# Patient Record
Sex: Male | Born: 1985 | Hispanic: No | Marital: Single | Smoking: Current every day smoker
Health system: Southern US, Community
[De-identification: ages and names within clinical notes are randomized; demographics above are authoritative.]

## PROBLEM LIST (undated history)

## (undated) DIAGNOSIS — R569 Unspecified convulsions: Secondary | ICD-10-CM

---

## 2010-06-12 ENCOUNTER — Emergency Department (HOSPITAL_COMMUNITY): Payer: No Typology Code available for payment source

## 2010-06-12 ENCOUNTER — Emergency Department (HOSPITAL_COMMUNITY)
Admission: EM | Admit: 2010-06-12 | Discharge: 2010-06-13 | Disposition: A | Discharge status: Other Acute Inpatient Hospital | Payer: No Typology Code available for payment source | Source: Home / Self Care | Attending: Emergency Medicine | Admitting: Emergency Medicine

## 2010-06-12 DIAGNOSIS — R51 Headache: Secondary | ICD-10-CM | POA: Insufficient documentation

## 2010-06-12 DIAGNOSIS — R569 Unspecified convulsions: Secondary | ICD-10-CM | POA: Insufficient documentation

## 2010-06-12 DIAGNOSIS — R109 Unspecified abdominal pain: Secondary | ICD-10-CM | POA: Insufficient documentation

## 2010-06-12 DIAGNOSIS — S2239XA Fracture of one rib, unspecified side, initial encounter for closed fracture: Secondary | ICD-10-CM | POA: Insufficient documentation

## 2010-06-12 DIAGNOSIS — S3600XA Unspecified injury of spleen, initial encounter: Secondary | ICD-10-CM | POA: Insufficient documentation

## 2010-06-12 DIAGNOSIS — R079 Chest pain, unspecified: Secondary | ICD-10-CM | POA: Insufficient documentation

## 2010-06-12 LAB — URINE MICROSCOPIC-ADD ON

## 2010-06-12 LAB — CBC
Hemoglobin: 13.1 g/dL (ref 13.0–17.0)
MCH: 31.4 pg (ref 26.0–34.0)
MCHC: 34.6 g/dL (ref 30.0–36.0)
MCV: 90.9 fL (ref 78.0–100.0)
RBC: 4.17 MIL/uL — ABNORMAL LOW (ref 4.22–5.81)

## 2010-06-12 LAB — DIFFERENTIAL
Lymphs Abs: 1.1 10*3/uL (ref 0.7–4.0)
Monocytes Absolute: 0.8 10*3/uL (ref 0.1–1.0)
Monocytes Relative: 8 % (ref 3–12)
Neutro Abs: 8.1 10*3/uL — ABNORMAL HIGH (ref 1.7–7.7)
Neutrophils Relative %: 79 % — ABNORMAL HIGH (ref 43–77)

## 2010-06-12 LAB — URINALYSIS, ROUTINE W REFLEX MICROSCOPIC
Nitrite: POSITIVE — AB
Protein, ur: 30 mg/dL — AB
Specific Gravity, Urine: 1.025 (ref 1.005–1.030)
Urobilinogen, UA: 1 mg/dL (ref 0.0–1.0)

## 2010-06-12 LAB — COMPREHENSIVE METABOLIC PANEL
ALT: 38 U/L (ref 0–53)
BUN: 19 mg/dL (ref 6–23)
CO2: 28 mEq/L (ref 19–32)
Calcium: 9.7 mg/dL (ref 8.4–10.5)
Creatinine, Ser: 0.83 mg/dL (ref 0.4–1.5)
GFR calc non Af Amer: >60 mL/min (ref >60)
Glucose, Bld: 102 mg/dL — ABNORMAL HIGH (ref 70–99)
Sodium: 138 mEq/L (ref 135–145)

## 2010-06-12 LAB — RAPID URINE DRUG SCREEN, HOSP PERFORMED
Amphetamines: NOT DETECTED
Benzodiazepines: NOT DETECTED
Tetrahydrocannabinol: POSITIVE — AB

## 2010-06-12 LAB — ETHANOL: Alcohol, Ethyl (B): <5 mg/dL (ref 0–10)

## 2010-06-12 MED ORDER — IOHEXOL 300 MG/ML  SOLN
100.0000 mL | Freq: Once | INTRAMUSCULAR | Status: AC | PRN
  Administered 2010-06-12: 100 mL via INTRAVENOUS

## 2010-06-13 ENCOUNTER — Inpatient Hospital Stay (HOSPITAL_COMMUNITY): Payer: No Typology Code available for payment source

## 2010-06-13 ENCOUNTER — Inpatient Hospital Stay (HOSPITAL_COMMUNITY)
Admission: AD | Admit: 2010-06-13 | Discharge: 2010-06-15 | DRG: 964 | Disposition: A | Discharge status: Home / Self Care | Payer: No Typology Code available for payment source | Source: Ambulatory Visit | Attending: General Surgery | Admitting: General Surgery

## 2010-06-13 DIAGNOSIS — Z560 Unemployment, unspecified: Secondary | ICD-10-CM

## 2010-06-13 DIAGNOSIS — F1010 Alcohol abuse, uncomplicated: Secondary | ICD-10-CM | POA: Diagnosis present

## 2010-06-13 DIAGNOSIS — R55 Syncope and collapse: Secondary | ICD-10-CM | POA: Diagnosis present

## 2010-06-13 DIAGNOSIS — S060X9A Concussion with loss of consciousness of unspecified duration, initial encounter: Secondary | ICD-10-CM | POA: Diagnosis present

## 2010-06-13 DIAGNOSIS — S0085XA Superficial foreign body of other part of head, initial encounter: Secondary | ICD-10-CM | POA: Diagnosis present

## 2010-06-13 DIAGNOSIS — S3600XA Unspecified injury of spleen, initial encounter: Secondary | ICD-10-CM | POA: Diagnosis present

## 2010-06-13 DIAGNOSIS — A498 Other bacterial infections of unspecified site: Secondary | ICD-10-CM | POA: Diagnosis present

## 2010-06-13 DIAGNOSIS — S1095XA Superficial foreign body of unspecified part of neck, initial encounter: Secondary | ICD-10-CM | POA: Diagnosis present

## 2010-06-13 DIAGNOSIS — F17200 Nicotine dependence, unspecified, uncomplicated: Secondary | ICD-10-CM | POA: Diagnosis present

## 2010-06-13 DIAGNOSIS — G40909 Epilepsy, unspecified, not intractable, without status epilepticus: Secondary | ICD-10-CM | POA: Diagnosis present

## 2010-06-13 DIAGNOSIS — N390 Urinary tract infection, site not specified: Secondary | ICD-10-CM | POA: Diagnosis present

## 2010-06-13 DIAGNOSIS — J9383 Other pneumothorax: Secondary | ICD-10-CM | POA: Diagnosis present

## 2010-06-13 DIAGNOSIS — F1210 Cannabis abuse, uncomplicated: Secondary | ICD-10-CM | POA: Diagnosis present

## 2010-06-13 DIAGNOSIS — G5790 Unspecified mononeuropathy of unspecified lower limb: Secondary | ICD-10-CM | POA: Diagnosis present

## 2010-06-13 DIAGNOSIS — S0005XA Superficial foreign body of scalp, initial encounter: Secondary | ICD-10-CM | POA: Diagnosis present

## 2010-06-13 DIAGNOSIS — M4120 Other idiopathic scoliosis, site unspecified: Secondary | ICD-10-CM | POA: Diagnosis present

## 2010-06-13 DIAGNOSIS — S239XXA Sprain of unspecified parts of thorax, initial encounter: Secondary | ICD-10-CM | POA: Diagnosis present

## 2010-06-13 DIAGNOSIS — S2249XA Multiple fractures of ribs, unspecified side, initial encounter for closed fracture: Principal | ICD-10-CM | POA: Diagnosis present

## 2010-06-13 LAB — GLUCOSE, CAPILLARY: Glucose-Capillary: 87 mg/dL (ref 70–99)

## 2010-06-13 LAB — BASIC METABOLIC PANEL
Calcium: 8.5 mg/dL (ref 8.4–10.5)
Chloride: 106 mEq/L (ref 96–112)
Creatinine, Ser: 0.77 mg/dL (ref 0.4–1.5)
GFR calc Af Amer: >60 mL/min (ref >60)

## 2010-06-13 LAB — CBC
MCH: 31.2 pg (ref 26.0–34.0)
Platelets: 216 10*3/uL (ref 150–400)
RBC: 3.72 MIL/uL — ABNORMAL LOW (ref 4.22–5.81)

## 2010-06-13 LAB — MRSA PCR SCREENING: MRSA by PCR: NEGATIVE

## 2010-06-14 ENCOUNTER — Inpatient Hospital Stay (HOSPITAL_COMMUNITY): Payer: No Typology Code available for payment source

## 2010-06-14 LAB — URINE CULTURE

## 2010-06-14 LAB — GLUCOSE, CAPILLARY: Glucose-Capillary: 150 mg/dL — ABNORMAL HIGH (ref 70–99)

## 2010-06-15 LAB — CBC
HCT: 33.3 % — ABNORMAL LOW (ref 39.0–52.0)
Hemoglobin: 11.3 g/dL — ABNORMAL LOW (ref 13.0–17.0)
WBC: 7.8 10*3/uL (ref 4.0–10.5)
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
## 2010-09-04 ENCOUNTER — Other Ambulatory Visit (HOSPITAL_COMMUNITY): Payer: Self-pay | Admitting: Chiropractic Medicine

## 2010-09-04 DIAGNOSIS — M25569 Pain in unspecified knee: Secondary | ICD-10-CM

## 2010-09-06 ENCOUNTER — Ambulatory Visit (HOSPITAL_COMMUNITY)
Admission: RE | Admit: 2010-09-06 | Discharge: 2010-09-06 | Disposition: A | Discharge status: Home / Self Care | Payer: Self-pay | Source: Ambulatory Visit | Attending: Chiropractic Medicine | Admitting: Chiropractic Medicine

## 2010-09-06 DIAGNOSIS — M6750 Plica syndrome, unspecified knee: Secondary | ICD-10-CM | POA: Insufficient documentation

## 2010-09-06 DIAGNOSIS — M25569 Pain in unspecified knee: Secondary | ICD-10-CM | POA: Insufficient documentation

## 2010-12-05 ENCOUNTER — Inpatient Hospital Stay (HOSPITAL_COMMUNITY)
Admission: RE | Admit: 2010-12-05 | Discharge: 2010-12-05 | Disposition: A | Discharge status: Home / Self Care | Payer: Self-pay | Source: Ambulatory Visit | Attending: Emergency Medicine | Admitting: Emergency Medicine

## 2013-04-10 ENCOUNTER — Encounter (HOSPITAL_COMMUNITY): Payer: Self-pay | Admitting: Emergency Medicine

## 2013-04-10 ENCOUNTER — Emergency Department (HOSPITAL_COMMUNITY)
Admission: EM | Admit: 2013-04-10 | Discharge: 2013-04-10 | Disposition: A | Discharge status: Home / Self Care | Payer: No Typology Code available for payment source | Attending: Emergency Medicine | Admitting: Emergency Medicine

## 2013-04-10 DIAGNOSIS — K029 Dental caries, unspecified: Secondary | ICD-10-CM | POA: Insufficient documentation

## 2013-04-10 DIAGNOSIS — K047 Periapical abscess without sinus: Secondary | ICD-10-CM

## 2013-04-10 DIAGNOSIS — Z8669 Personal history of other diseases of the nervous system and sense organs: Secondary | ICD-10-CM | POA: Insufficient documentation

## 2013-04-10 DIAGNOSIS — K0510 Chronic gingivitis, plaque induced: Secondary | ICD-10-CM | POA: Insufficient documentation

## 2013-04-10 DIAGNOSIS — K120 Recurrent oral aphthae: Secondary | ICD-10-CM | POA: Insufficient documentation

## 2013-04-10 DIAGNOSIS — K006 Disturbances in tooth eruption: Secondary | ICD-10-CM | POA: Insufficient documentation

## 2013-04-10 HISTORY — DX: Unspecified convulsions: R56.9

## 2013-04-10 MED ORDER — IBUPROFEN 800 MG PO TABS: 800.0000 mg | ORAL_TABLET | Freq: Three times a day (TID) | ORAL | Status: DC

## 2013-04-10 MED ORDER — PENICILLIN V POTASSIUM 250 MG PO TABS: 500.0000 mg | ORAL_TABLET | Freq: Four times a day (QID) | ORAL | Status: AC

## 2013-04-10 MED ORDER — HYDROCODONE-ACETAMINOPHEN 5-325 MG PO TABS: 1.0000 | ORAL_TABLET | Freq: Four times a day (QID) | ORAL | Status: DC | PRN

## 2013-04-10 MED ORDER — OXYCODONE-ACETAMINOPHEN 5-325 MG PO TABS
1.0000 | ORAL_TABLET | Freq: Once | ORAL | Status: AC
  Administered 2013-04-10: 1 via ORAL
  Filled 2013-04-10: qty 1

## 2013-04-10 NOTE — Discharge Instructions (Signed)
Brush teeth twice a day. Listerine rinses twice a day. Ibuprofen for pain. Norco for severe pain. Penicillin for infection until all gone. Follow up with a dentist. Return if worsening.    Dental Abscess A dental abscess is a collection of infected fluid (pus) from a bacterial infection in the inner part of the tooth (pulp). It usually occurs at the end of the tooth's root.  CAUSES   Severe tooth decay.  Trauma to the tooth that allows bacteria to enter into the pulp, such as a broken or chipped tooth. SYMPTOMS   Severe pain in and around the infected tooth.  Swelling and redness around the abscessed tooth or in the mouth or face.  Tenderness.  Pus drainage.  Bad breath.  Bitter taste in the mouth.  Difficulty swallowing.  Difficulty opening the mouth.  Nausea.  Vomiting.  Chills.  Swollen neck glands. DIAGNOSIS   A medical and dental history will be taken.  An examination will be performed by tapping on the abscessed tooth.  X-rays may be taken of the tooth to identify the abscess. TREATMENT The goal of treatment is to eliminate the infection. You may be prescribed antibiotic medicine to stop the infection from spreading. A root canal may be performed to save the tooth. If the tooth cannot be saved, it may be pulled (extracted) and the abscess may be drained.  HOME CARE INSTRUCTIONS  Only take over-the-counter or prescription medicines for pain, fever, or discomfort as directed by your caregiver.  Rinse your mouth (gargle) often with salt water ( tsp salt in 8 oz [250 ml] of warm water) to relieve pain or swelling.  Do not drive after taking pain medicine (narcotics).  Do not apply heat to the outside of your face.  Return to your dentist for further treatment as directed. SEEK MEDICAL CARE IF:  Your pain is not helped by medicine.  Your pain is getting worse instead of better. SEEK IMMEDIATE MEDICAL CARE IF:  You have a fever or persistent symptoms for  more than 2 3 days.  You have a fever and your symptoms suddenly get worse.  You have chills or a very bad headache.  You have problems breathing or swallowing.  You have trouble opening your mouth.  You have swelling in the neck or around the eye. Document Released: 02/12/2005 Document Revised: 11/07/2011 Document Reviewed: 05/23/2010 [facility] Patient Information 2014 [place], [place].  Gingivitis Gingivitis is a form of gum (periodontal) disease that causes redness, soreness, and swelling (inflammation) of your gums. CAUSES The most common cause of gingivitis is poor oral hygiene. A sticky substance made of bacteria, mucus, and food particles (plaque), is deposited on the exposed part of teeth. As plaque builds up, it reacts with the saliva in your mouth to form something called  tartar. Tartar is a hard deposit that becomes trapped around the base of the tooth. Plaque and tartar irritate the gums, leading to the formation of gingivitis. Other factors that increase your risk for gingivitis include:   Tobacco use.  Diabetes.  Older age.  Certain medications.  Certain viral or fungal infections.  Dry mouth.  Hormonal changes such as during pregnancy.  Poor nutrition.  Substance abuse.  Poor fitting dental restorations or appliances. SYMPTOMS You may notice inflammation of the soft tissue (gingiva) around the teeth. When these tissues become inflamed, they bleed easily, especially during flossing or brushing. The gums may also be:   Tender to the touch.  Bright red, purple red, or  have a shiny appearance.  Swollen.  Wearing away from the teeth (receding), which exposes more of the tooth. Bad breath is often present. Continued infection around teeth can eventually cause cavities and loosen teeth. This may lead to eventual tooth loss. DIAGNOSIS A medical and dental history will be taken. Your mouth, teeth, and gums will be examined. Your dentist will look for soft,  swollen purple-red, irritated gums. There may be deposits of plaque and tartar at the base of the teeth. Your gums will be looked at for the degree of redness, puffiness, and bleeding tendencies. Your dentist will see if any of the teeth are loose. X-rays may be taken to see if the inflammation has spread to the supporting structures of the teeth. TREATMENT The goal is to reduce and reverse the inflammation. Proper treatment can usually reverse the symptoms of gingivitis and prevent further progression of the disease. Have your teeth cleaned. During the cleaning, all plaque and tartar will be removed. Instruction for proper home care will be given. You will need regular professional cleanings and check-ups in the future. HOME CARE INSTRUCTIONS  Brush your teeth twice a day and floss at least once per day. When flossing, it is best to floss first then brush.  Limit sugar between meals and maintain a well-balanced diet.  Even the best dental hygiene will not prevent plaque from developing. It is necessary for you to see your dentist on a regular basis for cleaning and regular checkups.  Your dentist can recommend proper oral hygiene and mouth care and suggest special toothpastes or mouth rinses.  Stop smoking. SEEK DENTAL OR MEDICAL CARE IF:  You have painful, reddened tissue around your teeth, or you have puffy swollen gums.  You have difficulty chewing.  You notice any loose or infected teeth.  You have swollen glands.  Your gums bleed easily when you brush your teeth or are very tender to the touch. Document Released: 08/08/2000 Document Revised: 05/07/2011 Document Reviewed: 05/19/2010 [facility] Patient Information 2014 [place], [place].

## 2013-04-10 NOTE — ED Provider Notes (Signed)
CSN: [id number]     Arrival date & time 04/10/13  1209 History  This chart was scribed for non-physician practitioner Jaynie Crumble, PA-C working with Stephanie Acre Ward, DO by Dorothey Baseman, ED Scribe. This patient was seen in room TR06C/TR06C and the patient's care was started at 3:15 PM.  Chief Complaint  Patient presents with  . Dental Problem   The history is provided by the patient. No language interpreter was used.   HPI Comments: Bryan Randolph is a 28 y.o. male who presents to the Emergency Department complaining of a constant pain to the right, upper dentition onset 2-3 days ago that he states feels similar to his past dental abscesses. He reports some mild, right-sided facial swelling. He denies fever. He states he does not currently have a dentist. No trouble opening mouth. No difficulty swallowing. Patient has a history of seizures.   Past Medical History  Diagnosis Date  . Seizures    History reviewed. No pertinent past surgical history. History reviewed. No pertinent family history. History  Substance Use Topics  . Smoking status: Unknown If Ever Smoked  . Smokeless tobacco: Not on file  . Alcohol Use: Not on file    Review of Systems  Constitutional: Negative for fever.  HENT: Positive for dental problem and facial swelling.    Allergies  Review of patient's allergies indicates no known allergies.  Home Medications  No current outpatient prescriptions on file.  Triage Vitals: BP 118/65  Pulse 102  Temp(Src) 98.6 F (37 C) (Oral)  Resp 18  SpO2 96%  Physical Exam  Nursing note and vitals reviewed. Constitutional: He is oriented to person, place, and time. He appears well-developed and well-nourished. No distress.  HENT:  Head: Normocephalic and atraumatic.  Right Ear: Hearing, tympanic membrane, external ear and ear canal normal.  Left Ear: Hearing, tympanic membrane, external ear and ear canal normal.  Mouth/Throat: Oral lesions present. Abnormal  dentition. Dental abscesses and dental caries present.  Diffuse gingivitis and dental decay. Multiple aphthous ulcers. Right, upper, 2nd molar is tender to palpation with surrounding abscess. No sublingual swelling or trismus.   Eyes: Conjunctivae are normal.  Neck: Normal range of motion. Neck supple.  Pulmonary/Chest: Effort normal. No respiratory distress.  Abdominal: He exhibits no distension.  Musculoskeletal: Normal range of motion.  Neurological: He is alert and oriented to person, place, and time.  Skin: Skin is warm and dry.  Psychiatric: He has a normal mood and affect. His behavior is normal.    ED Course  Procedures (including critical care time)  DIAGNOSTIC STUDIES: Oxygen Saturation is 96% on room air, normal by my interpretation.    COORDINATION OF CARE: 3:16 PM- Will discharge patient with antibiotics and pain medication to manage symptoms. Advised patient to follow up with the referred dentist. Discussed treatment plan with patient at bedside and patient verbalized agreement.     Labs Review Labs Reviewed - No data to display Imaging Review No results found.  EKG Interpretation   None       MDM   Final diagnoses:  Dental abscess  Gingivitis    Patient with poor oral dentition, widespread gingival disease, right upper dental abscess. He is afebrile, otherwise nontoxic appearing. There is mild  right facial swelling. I will start him on penicillin, ibuprofen Norco for pain. Given referral to followup with a dentist. No signs of Ludwig's angina.   Filed Vitals:   04/10/13 1225 04/10/13 1544  BP: 118/65 110/70  Pulse: 102  90  Temp: 98.6 F (37 C)   TempSrc: Oral   Resp: 18 16  SpO2: 96% 100%    I personally performed the services described in this documentation, which was scribed in my presence. The recorded information has been reviewed and is accurate.     Lottie Musselatyana A Saamir Armstrong, PA-C 04/10/13 1629

## 2013-04-10 NOTE — ED Notes (Signed)
Pt in c/o dental pain and abscess for the last few days, denies fever

## 2013-04-11 NOTE — ED Provider Notes (Signed)
Medical screening examination/treatment/procedure(s) were performed by non-physician practitioner and as supervising physician I was immediately available for consultation/collaboration.  EKG Interpretation   None         Kristen N Ward, DO 04/11/13 0027 

## 2013-08-31 ENCOUNTER — Encounter (HOSPITAL_COMMUNITY): Payer: Self-pay | Admitting: Emergency Medicine

## 2013-08-31 ENCOUNTER — Emergency Department (HOSPITAL_COMMUNITY)
Admission: EM | Admit: 2013-08-31 | Discharge: 2013-08-31 | Disposition: A | Discharge status: Home / Self Care | Payer: No Typology Code available for payment source | Attending: Emergency Medicine | Admitting: Emergency Medicine

## 2013-08-31 DIAGNOSIS — Z792 Long term (current) use of antibiotics: Secondary | ICD-10-CM | POA: Insufficient documentation

## 2013-08-31 DIAGNOSIS — F17200 Nicotine dependence, unspecified, uncomplicated: Secondary | ICD-10-CM | POA: Insufficient documentation

## 2013-08-31 DIAGNOSIS — I10 Essential (primary) hypertension: Secondary | ICD-10-CM | POA: Insufficient documentation

## 2013-08-31 DIAGNOSIS — K0889 Other specified disorders of teeth and supporting structures: Secondary | ICD-10-CM | POA: Insufficient documentation

## 2013-08-31 LAB — CBC
HEMATOCRIT: 43.5 % (ref 39.0–52.0)
Hemoglobin: 14.7 g/dL (ref 13.0–17.0)
MCH: 31.5 pg (ref 26.0–34.0)
MCHC: 33.8 g/dL (ref 30.0–36.0)
MCV: 93.3 fL (ref 78.0–100.0)
PLATELETS: 200 10*3/uL (ref 150–400)
RBC: 4.66 MIL/uL (ref 4.22–5.81)
RDW: 12.8 % (ref 11.5–15.5)
WBC: 6.2 10*3/uL (ref 4.0–10.5)

## 2013-08-31 LAB — BASIC METABOLIC PANEL
ANION GAP: 12 (ref 5–15)
BUN: 7 mg/dL (ref 6–23)
CHLORIDE: 105 meq/L (ref 96–112)
CO2: 27 meq/L (ref 19–32)
CREATININE: 0.86 mg/dL (ref 0.50–1.35)
Calcium: 9.4 mg/dL (ref 8.4–10.5)
GFR calc Af Amer: >90 mL/min (ref >90)
GFR calc non Af Amer: >90 mL/min (ref >90)
Glucose, Bld: 86 mg/dL (ref 70–99)
Potassium: 4.1 mEq/L (ref 3.7–5.3)
Sodium: 144 mEq/L (ref 137–147)

## 2013-08-31 MED ORDER — OXYCODONE-ACETAMINOPHEN 5-325 MG PO TABS
1.0000 | ORAL_TABLET | Freq: Once | ORAL | Status: AC
Start: 2013-08-31 — End: 2013-08-31
  Administered 2013-08-31: 1 via ORAL
  Filled 2013-08-31: qty 1

## 2013-08-31 MED ORDER — AMOXICILLIN 500 MG PO CAPS: 500.0000 mg | ORAL_CAPSULE | Freq: Three times a day (TID) | ORAL | Status: AC

## 2013-08-31 MED ORDER — OXYCODONE-ACETAMINOPHEN 5-325 MG PO TABS: 1.0000 | ORAL_TABLET | Freq: Four times a day (QID) | ORAL | Status: AC | PRN

## 2013-08-31 NOTE — ED Provider Notes (Signed)
CSN: [id number]     Arrival date & time 08/31/13  1436 History   First MD Initiated Contact with Patient 08/31/13 1723     Chief Complaint  Patient presents with  . Dental Pain  . Hypertension   Patient is a 28 y.o. male presenting with tooth pain.  Dental Pain Location:  Generalized Quality:  Aching and pressure-like Severity:  Severe Onset quality:  Gradual Duration:  3 days Timing:  Constant Progression:  Worsening Chronicity:  Chronic Context: dental caries, dental fracture and poor dentition   Context: not abscess, cap still on, not crown fracture, not enamel fracture, filling still in place, not intrusion, not malocclusion, not recent dental surgery and not trauma   Relieved by:  Nothing Worsened by:  Nothing tried Ineffective treatments:  None tried Associated symptoms: no congestion, no difficulty swallowing, no drooling, no facial pain, no facial swelling, no fever, no gum swelling, no headaches, no neck pain, no neck swelling, no oral bleeding, no oral lesions and no trismus   Risk factors: lack of dental care and smoking   Risk factors: no alcohol problem, no cancer, no chewing tobacco use, no diabetes, no immunosuppression and no periodontal disease     Past Medical History  Diagnosis Date  . Seizures    History reviewed. No pertinent past surgical history. History reviewed. No pertinent family history. History  Substance Use Topics  . Smoking status: Current Every Day Smoker    Types: Cigarettes  . Smokeless tobacco: Not on file  . Alcohol Use: No    Review of Systems  Constitutional: Negative for fever, chills and fatigue.  HENT: Negative for congestion, drooling, facial swelling and mouth sores.   Respiratory: Negative for chest tightness and shortness of breath.   Cardiovascular: Negative for chest pain and palpitations.  Gastrointestinal: Negative for nausea, vomiting, abdominal pain, diarrhea, constipation and blood in stool.  Musculoskeletal: Negative  for neck pain.  Neurological: Negative for headaches.  All other systems reviewed and are negative.     Allergies  Review of patient's allergies indicates no known allergies.  Home Medications   Prior to Admission medications   Medication Sig Start Date End Date Taking? Authorizing Provider  amoxicillin (AMOXIL) 500 MG capsule Take 1 capsule (500 mg total) by mouth 3 (three) times daily. 08/31/13   Yaniv Lage A Forcucci, PA-C  oxyCODONE-acetaminophen (PERCOCET/ROXICET) 5-325 MG per tablet Take 1 tablet by mouth every 6 (six) hours as needed for moderate pain or severe pain. 08/31/13   Kathan Kirker A Forcucci, PA-C   BP 107/83  Pulse 57  Temp(Src) 98.6 F (37 C) (Oral)  Resp 16  Ht 6' (1.829 m)  Wt 134 lb 11.2 oz (61.1 kg)  BMI 18.26 kg/m2  SpO2 98% Physical Exam  Nursing note and vitals reviewed. Constitutional: He is oriented to person, place, and time. He appears well-developed and well-nourished. No distress.  HENT:  Head: Normocephalic and atraumatic.  Mouth/Throat: Oropharynx is clear and moist and mucous membranes are normal. He does not have dentures. No oral lesions. No trismus in the jaw. Abnormal dentition. Dental caries present. No dental abscesses, uvula swelling or lacerations. No oropharyngeal exudate, posterior oropharyngeal edema, posterior oropharyngeal erythema or tonsillar abscesses.  Patient had multiple caries, tooth fractures, and decay in all teeth present.  No obvious erythema or fluctuance of the gums present at this time.    Eyes: Conjunctivae and EOM are normal. Pupils are equal, round, and reactive to light. No scleral icterus.  Neck: Normal range  of motion. Neck supple. No JVD present. No tracheal deviation present. No Brudzinski's sign and no Kernig's sign noted. No thyromegaly present.  Cardiovascular: Normal rate, regular rhythm, normal heart sounds and intact distal pulses.  Exam reveals no gallop and no friction rub.   No murmur heard. Pulmonary/Chest:  Effort normal and breath sounds normal. No stridor. No respiratory distress. He has no wheezes. He has no rales. He exhibits no tenderness.  Abdominal: Soft. Bowel sounds are normal. He exhibits no distension and no mass. There is no tenderness. There is no rebound and no guarding.  Musculoskeletal: Normal range of motion.  Lymphadenopathy:    He has no cervical adenopathy.  Neurological: He is alert and oriented to person, place, and time.  Skin: Skin is warm and dry. No rash noted. He is not diaphoretic. No erythema. No pallor.  Psychiatric: He has a normal mood and affect. His behavior is normal. Judgment and thought content normal.    ED Course  Procedures (including critical care time) Labs Review Labs Reviewed  CBC  BASIC METABOLIC PANEL    Imaging Review No results found.   EKG Interpretation None      MDM   Final diagnoses:  Pain, dental   Patient is a 28 y.o. Male who presents to the ED for dental pain and concern for high blood pressure.  Patient's girlfriend stated that the patient had high blood pressure yesterday when she took it at home with a reading of 150/85.  Patient's physical exam shows poor dentition with multiple fractures and extensive decay of the mouth.  I have provided the patient with the dental resource list at this time and have given him a prescription for oxycodone for pain relief and also a prescription for amoxicillin as periapical abscess cannot be ruled out at this time.  Patient's BP here at this time is WNL.  Patient and girlfriend were reassured that BP does increase due to pain and that CBC and BMP at this time are unremarkable.  He was instructed to follow up with a dentist and was given contact information for Dr. Georgie Chardivil.  He was told to take his antibiotic until it was gone and to stop smoking.  He states understanding and agreement with the above plan.  He was told to return for trismus, drooling, or shortness of breath.      Eben Burowourtney A  Forcucci, PA-C 08/31/13 1952

## 2013-08-31 NOTE — ED Notes (Addendum)
hes had dental pain since yesterday. States several of his teeth are broken. He also states yesterday he didn't feel well and he felt sweaty and his wife checked his BP and it was 150/81. She states "i think he was having a panic attack."

## 2013-08-31 NOTE — Discharge Instructions (Signed)
Dental Pain °Toothache is pain in or around a tooth. It may get worse with chewing or with cold or heat.  °HOME CARE °· Your dentist may use a numbing medicine during treatment. If so, you may need to avoid eating until the medicine wears off. Ask your dentist about this. °· Only take medicine as told by your dentist or doctor. °· Avoid chewing food near the painful tooth until after all treatment is done. Ask your dentist about this. °GET HELP RIGHT AWAY IF:  °· The problem gets worse or new problems appear. °· You have a fever. °· There is redness and puffiness (swelling) of the face, jaw, or neck. °· You cannot open your mouth. °· There is pain in the jaw. °· There is very bad pain that is not helped by medicine. °MAKE SURE YOU:  °· Understand these instructions. °· Will watch your condition. °· Will get help right away if you are not doing well or get worse. °Document Released: 08/01/2007 Document Revised: 05/07/2011 Document Reviewed: 08/01/2007 °ExitCare® Patient Information ©2015 [employer]. This information is not intended to replace advice given to you by your health care provider. Make sure you discuss any questions you have with your health care provider. ° ° ° °Emergency Department Resource Guide °1) Find a Doctor and Pay Out of Pocket °Although you won't have to find out who is covered by your insurance plan, it is a good idea to ask around and get recommendations. You will then need to call the office and see if the doctor you have chosen will accept you as a new patient and what types of options they offer for patients who are self-pay. Some doctors offer discounts or will set up payment plans for their patients who do not have insurance, but you will need to ask so you aren't surprised when you get to your appointment. ° °2) Contact Your Local Health Department °Not all health departments have doctors that can see patients for sick visits, but many do, so it is worth a call to see if yours does.  If you don't know where your local health department is, you can check in your phone book. The CDC also has a tool to help you locate your state's health department, and many state websites also have listings of all of their local health departments. ° °3) Find a Walk-in Clinic °If your illness is not likely to be very severe or complicated, you may want to try a walk in clinic. These are popping up all over the country in pharmacies, drugstores, and shopping centers. They're usually staffed by nurse practitioners or physician assistants that have been trained to treat common illnesses and complaints. They're usually fairly quick and inexpensive. However, if you have serious medical issues or chronic medical problems, these are probably not your best option. ° °No Primary Care Doctor: °- Call [facility] at  832-8000 - they can help you locate a primary care doctor that  accepts your insurance, provides certain services, etc. °- Physician Referral Service- 1-800-533-3463 ° °Chronic Pain Problems: °Organization         Address  Phone   Notes  °[facility]  (336) 297-2271 Patients need to be referred by their primary care doctor.  ° °Medication Assistance: °Organization         Address  Phone   Notes  °[place] [facility] [address] °[place], [place] [address] (336) 641-8030 --Must be a resident   of [place] °-- Must have NO insurance coverage whatsoever (no Medicaid/ Medicare, etc.) °-- The pt. MUST have a primary care doctor that directs their care regularly and follows them in the community °  °[facility]  (866) 331-1348   °[employer]  (888) 892-1162   ° °Agencies that provide inexpensive medical care: °Organization         Address  Phone   Notes  °[facility]  (336) 832-8035   °[facility]    (336) 832-7272   °[facility] [address] °[place], [place] [address] (336) 832-4777   °[facility] [address], °[place] (336) 271-4999   °[facility]    (336) 373-0678   °[facility]    (336) 272-1050   °[facility] ° [address], [place] Phone:  (336) 832-4444, Fax:  (336) 832-4440 Hours of Operation:  9 am - 6 pm, M-F.  Also accepts Medicaid/Medicare and self-pay.  °[facility] ° [address], [place] Phone: (336) 832-3150, Fax: (336) 832-3151. Hours of Operation:  8:30 am - 5:30 pm, M-F.  Also accepts Medicaid and self-pay.  °[facility] [place] [address], [place] Phone: (336) 878-6027   °[facility] [address], [place], [place] (336)723-1848, Ext. 123 Mondays & Thursdays: 7-9 AM.  First 15 patients are seen on a first come, first serve basis. °  ° °Medicaid-accepting [place] Providers: ° °Organization         Address  Phone   Notes  °[facility] [address], [place] (336) 641-2100 Also accepts self-pay patients.  °[facility] [address], [place] ° (336) 856-9996   °[facility] [address], [place] (336) 288-8857   °[facility] [address] [place] [address], [place] (336) 299-7000   °Veita Bland [address], [place]  ° (336) 373-1557 Only accepts [place] Access Medicaid patients after they have their name applied to their card.  ° °Self-Pay (no insurance) in [place]: ° °Organization         Address  Phone   Notes  °Sickle Cell Patients, [facility] [address], [place] (336) 832-1970   °[facility] [address], [place] (336) 832-4400   °[facility] ° [address], [place] (336) 992-4800   °[facility]/Dr. Osei-Bonsu ° [address] [place] [address], [place] or [address], [place] (336) 841-8500  Phone number for both [place] and [place] locations is the same.  °[facility] [address], [place] (336) 299-0000   °[facility] [address] [place] [address], [place] or [address] (336) 852-7530 °(336) 878-2260   °[facility] [address],  (336) 350-1642, phone; (336) 294-5005, fax Sees patients 1st and 3rd Saturday of every month.  Must not qualify for public or private insurance (i.e. Medicaid, Medicare, [place] Health Choice, Veterans' Benefits) • Household income should be no more than 200% of the poverty level •The clinic cannot treat you if you are pregnant or think you are pregnant • Sexually transmitted diseases are not treated at the clinic.  ° ° °Dental Care: °Organization         Address    Phone  Notes  °[facility] [facility] [address], [place] (336) 641-6152 Accepts children up to age 21 who are enrolled in Medicaid or [place] Health Choice; pregnant women with a Medicaid card; and children who have applied for Medicaid or [place] Health Choice, but were declined, whose parents can pay a reduced fee at time of service.  °[facility] [place]  [address], [place] (336) 641-7733 Accepts children up to age 21 who are enrolled in Medicaid or [place] Health Choice; pregnant women with a Medicaid card; and children who have applied for Medicaid or [place] Health Choice, but were declined, whose parents can pay a reduced fee at time of service.  °[facility] ° [address], [place] (336) 641-4533 Patients are seen by appointment only. Walk-ins are not accepted. [facility] will see patients 18 years of age and older. °Monday - Tuesday (8am-5pm) °Most Wednesdays (8:30-5pm) °$30 per visit, cash only  °[facility] ° [address], [place] (336) 641-4533 Patients are seen by appointment  only. Walk-ins are not accepted. [facility] will see patients 18 years of age and older. °One Wednesday Evening (Monthly: Volunteer Based).  $30 per visit, cash only  °[facility]  (919) 537-3737 for adults; Children under age 4, call Graduate Pediatric Dentistry at (919) 537-3956. Children aged 4-14, please call (919) 537-3737 to request a pediatric application. ° Dental services are provided in all areas of dental care including fillings, crowns and bridges, complete and partial dentures, implants, gum treatment, root canals, and extractions. Preventive care is also provided. Treatment is provided to both adults and children. °Patients are selected via a lottery and there is often a waiting list. °  °[facility] [address], °[place] ° (336) 763-8833 [email or web address] °  °[facility] [address], [place], [place] (336)723-1848, Ext. 123 Second and Fourth Thursday of each month, opens at 6:30 AM; Clinic ends at 9 AM.  Patients are seen on a first-come first-served basis, and a limited number are seen during each clinic.  ° °[facility] ° [address], [place], [place] (336) 723-7904   Eligibility Requirements °You must have lived in [place], [place], or [place] counties for at least the last three months. °  You cannot be eligible for state or federal sponsored healthcare insurance, including Veterans Administration, Medicaid, or Medicare. °  You generally cannot be eligible for healthcare insurance through your employer.  °  How to apply: °Eligibility screenings are held every Tuesday and Wednesday afternoon from 1:00 pm until 4:00 pm. You do not need an appointment for the interview!  °[facility] [address], [place], [place] 336-631-2330   °[facility] [facility]  336-342-8273   °[facility]  336-703-3100   °[facility]  336-570-6415   ° °Behavioral Health  Resources in the Community: °Intensive Outpatient Programs °Organization         Address  Phone  Notes  °[facility] [address], [place], [place] 336-878-6098   °[facility] [address], [place], [place] 336-832-9800   °[facility] [address], [place], [place] ° 336-882-2125   °[facility] [address],  °[place],  1-800-853-5163 or 336-641-4981   °Substance Abuse Resources °Organization           Address  Phone  Notes  °[facility]  336-882-2125   °[facility]  336-784-9470   °[facility]  336-285-9073   °[facility]  336-845-3988   °Residential & Outpatient Substance Abuse Program  1-800-659-3381   °Psychological Services °Organization         Address  Phone  Notes  °[facility]  336- 832-9600   °[facility]  336- 378-7881   °[facility] [address], [place] 1-800-853-5163 or 336-641-4981   ° °Mobile Crisis Teams °Organization         Address  Phone  Notes  °[facility], Mobile Crisis Care Unit  1-877-626-1772   °Assertive °[facility] ° [address] [place], [place] 336-834-9664   °Sharon DeEsch [address] °[place] 336-554-5454   ° °Self-Help/Support Groups °Organization         Address  Phone             Notes  °[facility] - variety of support groups  336- 373-1402 Call for more information  °Narcotics Anonymous (NA), [facility] [address], °[place]  2 meetings at this location  ° °Residential Treatment Programs °Organization         Address  Phone  Notes  °[facility] [address],    °[place]  1-866-801-8205   °[facility] ° [address], [place], [place] 704-293-8524   °[facility] [address], [place] 336-845-3988 Admissions: 8am-3pm M-F  °[facility] [address]    °[place], [place] 336-841-1104   °[facility] [address], [place], [place] 336-379-7146   °[facility] [address]  °[place], [place] 336-285-9073   °[facility] - Intensive Outpatient [address], [place], [place] 336-852-3033   °[facility] [address]  °[place], [place] 1-877-615-2722 or 336-784-9470   °[facility] [address] [place], [place] 336-227-7417 Accepts Medicaid  °[facility] [address]  ° [place] 1-800-659-3381 Substance Abuse/Addiction Treatment  ° °[place] Behavioral Health Resources °Organization         Address  Phone  Notes  °[facility]  (888) 581-9988   °Julie Brannon, PhD [address] [place], [place]   (336) 349-5553 or (336) 951-0000   °[facility]   [address] °[place], [place] (336) 349-4454   °[facility] [address], [place], [place] (336) 342-8316 Insurance/Medicaid/sponsorship through [facility]  °[facility] [address]                                    [place], [place] (336) 342-8316 Therapy/tele-psych/case  °[facility] [address]  ° [place], [place] (336) 349-2233    °Dr. Arfeen  (336) 349-4544   °[facility] [place]  [employer] [place] [facility] 1) [address],  °2) [address], [place] °3)  [address], [place] (336) 349-3220 °(336) 342-7768 ° °(336) 342-8140   °[place] [facility] (336) 342-1394 or (336) 342-3537 (After Hours)    ° °  °

## 2013-09-01 NOTE — ED Provider Notes (Signed)
Medical screening examination/treatment/procedure(s) were performed by non-physician practitioner and as supervising physician I was immediately available for consultation/collaboration.   EKG Interpretation None        Richardean Canalavid H Merla Sawka, MD 09/01/13 1133

## 2014-03-17 ENCOUNTER — Emergency Department (HOSPITAL_COMMUNITY)
Admission: EM | Admit: 2014-03-17 | Discharge: 2014-03-17 | Disposition: A | Discharge status: Home / Self Care | Payer: Self-pay | Attending: Emergency Medicine | Admitting: Emergency Medicine

## 2014-03-17 ENCOUNTER — Emergency Department (HOSPITAL_COMMUNITY): Payer: Self-pay

## 2014-03-17 ENCOUNTER — Encounter (HOSPITAL_COMMUNITY): Payer: Self-pay | Admitting: Emergency Medicine

## 2014-03-17 DIAGNOSIS — W2209XA Striking against other stationary object, initial encounter: Secondary | ICD-10-CM | POA: Insufficient documentation

## 2014-03-17 DIAGNOSIS — S6992XA Unspecified injury of left wrist, hand and finger(s), initial encounter: Secondary | ICD-10-CM | POA: Insufficient documentation

## 2014-03-17 DIAGNOSIS — Y9289 Other specified places as the place of occurrence of the external cause: Secondary | ICD-10-CM | POA: Insufficient documentation

## 2014-03-17 DIAGNOSIS — Z720 Tobacco use: Secondary | ICD-10-CM | POA: Insufficient documentation

## 2014-03-17 DIAGNOSIS — Y9389 Activity, other specified: Secondary | ICD-10-CM | POA: Insufficient documentation

## 2014-03-17 DIAGNOSIS — T1490XA Injury, unspecified, initial encounter: Secondary | ICD-10-CM

## 2014-03-17 DIAGNOSIS — Z792 Long term (current) use of antibiotics: Secondary | ICD-10-CM | POA: Insufficient documentation

## 2014-03-17 DIAGNOSIS — Y998 Other external cause status: Secondary | ICD-10-CM | POA: Insufficient documentation

## 2014-03-17 MED ORDER — NAPROXEN 500 MG PO TABS: 500.0000 mg | ORAL_TABLET | Freq: Two times a day (BID) | ORAL | Status: AC

## 2014-03-17 MED ORDER — NAPROXEN 250 MG PO TABS
500.0000 mg | ORAL_TABLET | Freq: Once | ORAL | Status: AC
  Administered 2014-03-17: 500 mg via ORAL
  Filled 2014-03-17: qty 2

## 2014-03-17 MED ORDER — HYDROCODONE-ACETAMINOPHEN 5-325 MG PO TABS
2.0000 | ORAL_TABLET | Freq: Once | ORAL | Status: AC
  Administered 2014-03-17: 2 via ORAL
  Filled 2014-03-17: qty 2

## 2014-03-17 NOTE — Progress Notes (Signed)
Orthopedic Tech Progress Note Patient Details:  Bryan DubinJohn E Randolph 08/26/1985 [id number]  Ortho Devices Type of Ortho Device: Thumb velcro splint Ortho Device/Splint Location: lle Ortho Device/Splint Interventions: Application   [name] 03/17/2014, 11:50 AM

## 2014-03-17 NOTE — ED Provider Notes (Signed)
CSN: [id number]     Arrival date & time 03/17/14  4098 History  This chart was scribed for non-physician practitioner working with Lyanne Co, MD by Richarda Overlie, ED Scribe. This patient was seen in room TR08C/TR08C and the patient's care was started at 10:30 AM.      Chief Complaint  Patient presents with  . Hand Injury   The history is provided by the patient. No language interpreter was used.   HPI Comments: Bryan Randolph is a 29 y.o. male who presents to the Emergency Department complaining of left thumb pain from an injury that occurred at [facility] yesterday. He rates his pain as a 7/10 at this time. Pt states he was trying to keep a tree limb from hitting something and the limb hit his thumb and made it go backwards. Pt reports associated increasing swelling to the affected area. He states his pain worsens with certain movements. He denies any lacerations. Pt denies any other symptoms. He reports NKDA.   Past Medical History  Diagnosis Date  . Seizures    History reviewed. No pertinent past surgical history. No family history on file. History  Substance Use Topics  . Smoking status: Current Every Day Smoker    Types: Cigarettes  . Smokeless tobacco: Not on file  . Alcohol Use: No    Review of Systems  Musculoskeletal: Positive for joint swelling and arthralgias.  All other systems reviewed and are negative.   Allergies  Review of patient's allergies indicates no known allergies.  Home Medications   Prior to Admission medications   Medication Sig Start Date End Date Taking? Authorizing Provider  amoxicillin (AMOXIL) 500 MG capsule Take 1 capsule (500 mg total) by mouth 3 (three) times daily. 08/31/13   Courtney A Forcucci, PA-C  oxyCODONE-acetaminophen (PERCOCET/ROXICET) 5-325 MG per tablet Take 1 tablet by mouth every 6 (six) hours as needed for moderate pain or severe pain. 08/31/13   Courtney A Forcucci, PA-C   BP 121/91 mmHg  Pulse 82  Temp(Src) 98.5 F (36.9 C)  (Oral)  Resp 18  SpO2 100% Physical Exam  Constitutional: He is oriented to person, place, and time. He appears well-developed and well-nourished.  HENT:  Head: Normocephalic and atraumatic.  Eyes: Right eye exhibits no discharge. Left eye exhibits no discharge.  Neck: Neck supple. No tracheal deviation present.  Cardiovascular: Normal rate.   Pulmonary/Chest: Effort normal. No respiratory distress.  Abdominal: He exhibits no distension.  Musculoskeletal: He exhibits tenderness.  Swelling noted to proximal joint. TTP over anatomical snuff box. No obvious deformity noted but significant swelling TTP. Good ROM in left wrist.  Neurological: He is alert and oriented to person, place, and time.  4/5 with flexion and extension in the left thumb.  Skin: Skin is warm and dry.  Psychiatric: He has a normal mood and affect. His behavior is normal.  Nursing note and vitals reviewed.   ED Course  Procedures   DIAGNOSTIC STUDIES: Oxygen Saturation is 100% on RA, normal by my interpretation.    COORDINATION OF CARE: 10:34 AM Discussed treatment plan with pt at bedside and pt agreed to plan.   Labs Review Labs Reviewed - No data to display  Imaging Review No results found.   EKG Interpretation None      MDM   Final diagnoses:  Injury  Trauma  Hand injury, left, initial encounter   29 yo with thumb injury.  His X-Ray is negative for obvious fracture or dislocation. His pain  was managed in ED. Thumb spica splint placed for comfort and referral for hand follow-up if symptoms persist. Conservative therapy recommended and discussed. Patient will be dc home & is agreeable with above plan.  I personally performed the services described in this documentation, which was scribed in my presence. The recorded information has been reviewed and is accurate.  Filed Vitals:   03/17/14 0958 03/17/14 1214  BP: 121/91 114/62  Pulse: 82 67  Temp: 98.5 F (36.9 C) 97.7 F (36.5 C)  TempSrc:  Oral Oral  Resp: 18 18  SpO2: 100% 63%   Meds given in ED:  Medications  HYDROcodone-acetaminophen (NORCO/VICODIN) 5-325 MG per tablet 2 tablet (2 tablets Oral Given 03/17/14 1057)  naproxen (NAPROSYN) tablet 500 mg (500 mg Oral Given 03/17/14 1057)    Discharge Medication List as of 03/17/2014 12:09 PM    START taking these medications   Details  naproxen (NAPROSYN) 500 MG tablet Take 1 tablet (500 mg total) by mouth 2 (two) times daily with a meal., Starting 03/17/2014, Until Discontinued, Print            Harle BattiestElizabeth Lynn Recendiz, NP 03/18/14 [id number]  Lyanne CoKevin M Campos, MD 03/18/14 [phone]

## 2014-03-17 NOTE — ED Notes (Signed)
Ortho tech at bedside 

## 2014-03-17 NOTE — ED Notes (Signed)
Patient states he was trying to keep a limb from hitting something and the limb hit his thumb and it "went backwards".  Patient states swelling and pain to L thumb and hand.   Patient denies other symptoms.

## 2014-03-17 NOTE — Discharge Instructions (Signed)
Follow directions provided. Be sure to follow-up with the hand doctor if your pain does not improve. Please take the naproxen twice a day for pain and inflammation. Wear your splint as needed for comfort. Don't hesitate to return for any new, worsening, or concerning symptoms.  SEEK IMMEDIATE MEDICAL CARE IF:  You have increased redness, swelling, or pain in your hand.  Your swelling or pain is not relieved with medicines.  You have loss of feeling in your hand or are unable to move your fingers.  Your hand turns cold or blue.  You have pain when you move your fingers.  Your hand becomes warm to the touch.  Your contusion does not improve in 2 days.

## 2014-07-30 ENCOUNTER — Emergency Department (HOSPITAL_COMMUNITY)
Admission: EM | Admit: 2014-07-30 | Discharge: 2014-07-30 | Disposition: A | Discharge status: Home / Self Care | Payer: Self-pay | Attending: Emergency Medicine | Admitting: Emergency Medicine

## 2014-07-30 ENCOUNTER — Encounter (HOSPITAL_COMMUNITY): Payer: Self-pay | Admitting: Emergency Medicine

## 2014-07-30 DIAGNOSIS — Z720 Tobacco use: Secondary | ICD-10-CM | POA: Insufficient documentation

## 2014-07-30 DIAGNOSIS — R197 Diarrhea, unspecified: Secondary | ICD-10-CM | POA: Insufficient documentation

## 2014-07-30 DIAGNOSIS — J029 Acute pharyngitis, unspecified: Secondary | ICD-10-CM | POA: Insufficient documentation

## 2014-07-30 DIAGNOSIS — M791 Myalgia: Secondary | ICD-10-CM | POA: Insufficient documentation

## 2014-07-30 DIAGNOSIS — R112 Nausea with vomiting, unspecified: Secondary | ICD-10-CM | POA: Insufficient documentation

## 2014-07-30 DIAGNOSIS — J3489 Other specified disorders of nose and nasal sinuses: Secondary | ICD-10-CM | POA: Insufficient documentation

## 2014-07-30 LAB — CBC WITH DIFFERENTIAL/PLATELET
Basophils Absolute: 0 10*3/uL (ref 0.0–0.1)
Basophils Relative: 0 % (ref 0–1)
EOS ABS: 0 10*3/uL (ref 0.0–0.7)
EOS PCT: 0 % (ref 0–5)
HCT: 44 % (ref 39.0–52.0)
Hemoglobin: 14.9 g/dL (ref 13.0–17.0)
LYMPHS ABS: 1.5 10*3/uL (ref 0.7–4.0)
Lymphocytes Relative: 9 % — ABNORMAL LOW (ref 12–46)
MCH: 31.2 pg (ref 26.0–34.0)
MCHC: 33.9 g/dL (ref 30.0–36.0)
MCV: 92.1 fL (ref 78.0–100.0)
MONOS PCT: 4 % (ref 3–12)
Monocytes Absolute: 0.7 10*3/uL (ref 0.1–1.0)
Neutro Abs: 14.7 10*3/uL — ABNORMAL HIGH (ref 1.7–7.7)
Neutrophils Relative %: 87 % — ABNORMAL HIGH (ref 43–77)
Platelets: 164 10*3/uL (ref 150–400)
RBC: 4.78 MIL/uL (ref 4.22–5.81)
RDW: 12.8 % (ref 11.5–15.5)
WBC: 17 10*3/uL — ABNORMAL HIGH (ref 4.0–10.5)

## 2014-07-30 LAB — COMPREHENSIVE METABOLIC PANEL
ALBUMIN: 4.6 g/dL (ref 3.5–5.0)
ALK PHOS: 66 U/L (ref 38–126)
ALT: 18 U/L (ref 17–63)
ANION GAP: 12 (ref 5–15)
AST: 27 U/L (ref 15–41)
BILIRUBIN TOTAL: 0.7 mg/dL (ref 0.3–1.2)
BUN: 16 mg/dL (ref 6–20)
CALCIUM: 9.7 mg/dL (ref 8.9–10.3)
CO2: 24 mmol/L (ref 22–32)
CREATININE: 1.2 mg/dL (ref 0.61–1.24)
Chloride: 103 mmol/L (ref 101–111)
GFR calc Af Amer: >60 mL/min (ref >60)
Glucose, Bld: 106 mg/dL — ABNORMAL HIGH (ref 65–99)
POTASSIUM: 4.3 mmol/L (ref 3.5–5.1)
Sodium: 139 mmol/L (ref 135–145)
TOTAL PROTEIN: 8.3 g/dL — AB (ref 6.5–8.1)

## 2014-07-30 LAB — RAPID STREP SCREEN (MED CTR MEBANE ONLY): STREPTOCOCCUS, GROUP A SCREEN (DIRECT): NEGATIVE

## 2014-07-30 LAB — LIPASE, BLOOD: LIPASE: 16 U/L — AB (ref 22–51)

## 2014-07-30 NOTE — Discharge Instructions (Signed)

## 2014-07-30 NOTE — ED Notes (Signed)
Pt states that he has been having NVD and sore throat with subjective fever x 2 days.  Afebrile here.

## 2014-07-30 NOTE — ED Provider Notes (Signed)
CSN: [id number]     Arrival date & time 07/30/14  1234 History   First MD Initiated Contact with Patient 07/30/14 1507     Chief Complaint  Patient presents with  . Nausea  . Emesis  . Diarrhea  . Sore Throat     (Consider location/radiation/quality/duration/timing/severity/associated sxs/prior Treatment) HPI Comments: Patient presents with a 2 day history of sore throat and fever. He's had a fever up to 102. He states it hurts to swallow. He's also had some rhinorrhea. He's had some vomiting and diarrhea as well. His last emesis was last night. He denies abdominal pain. He denies any cough or chest congestion. He denies any rash. He states it hurts to swallow but he is able to handle secretions. He's been using ibuprofen with some relief in symptoms.  Patient is a 29 y.o. male presenting with vomiting, diarrhea, and pharyngitis.  Emesis Associated symptoms: diarrhea, myalgias and sore throat   Associated symptoms: no abdominal pain, no arthralgias, no chills and no headaches   Diarrhea Associated symptoms: fever, myalgias and vomiting   Associated symptoms: no abdominal pain, no arthralgias, no chills, no diaphoresis and no headaches   Sore Throat Pertinent negatives include no chest pain, no abdominal pain, no headaches and no shortness of breath.    Past Medical History  Diagnosis Date  . Seizures    History reviewed. No pertinent past surgical history. History reviewed. No pertinent family history. History  Substance Use Topics  . Smoking status: Current Every Day Smoker    Types: Cigarettes  . Smokeless tobacco: Not on file  . Alcohol Use: No    Review of Systems  Constitutional: Positive for fever and fatigue. Negative for chills and diaphoresis.  HENT: Positive for congestion, rhinorrhea and sore throat. Negative for sneezing.   Eyes: Negative.   Respiratory: Negative for cough, chest tightness and shortness of breath.   Cardiovascular: Negative for chest pain and  leg swelling.  Gastrointestinal: Positive for nausea, vomiting and diarrhea. Negative for abdominal pain and blood in stool.  Genitourinary: Negative for frequency, hematuria, flank pain and difficulty urinating.  Musculoskeletal: Positive for myalgias. Negative for back pain and arthralgias.  Skin: Negative for rash.  Neurological: Negative for dizziness, speech difficulty, weakness, numbness and headaches.      Allergies  Review of patient's allergies indicates no known allergies.  Home Medications   Prior to Admission medications   Medication Sig Start Date End Date Taking? Authorizing Provider  ibuprofen (ADVIL,MOTRIN) 200 MG tablet Take 400 mg by mouth every 6 (six) hours as needed for fever, headache, mild pain or moderate pain.   Yes Historical Provider, MD  amoxicillin (AMOXIL) 500 MG capsule Take 1 capsule (500 mg total) by mouth 3 (three) times daily. Patient not taking: Reported on 07/30/2014 08/31/13   Terri Piedra, PA-C  naproxen (NAPROSYN) 500 MG tablet Take 1 tablet (500 mg total) by mouth 2 (two) times daily with a meal. Patient not taking: Reported on 07/30/2014 03/17/14   Harle Battiest, NP  oxyCODONE-acetaminophen (PERCOCET/ROXICET) 5-325 MG per tablet Take 1 tablet by mouth every 6 (six) hours as needed for moderate pain or severe pain. Patient not taking: Reported on 07/30/2014 08/31/13   Toni Amend Forcucci, PA-C   BP 113/78 mmHg  Pulse 71  Temp(Src) 98.4 F (36.9 C) (Oral)  Resp 18  SpO2 100% Physical Exam  Constitutional: He is oriented to person, place, and time. He appears well-developed and well-nourished. No distress.  HENT:  Head: Normocephalic and atraumatic.  Right Ear: External ear normal.  Left Ear: External ear normal.  Mouth/Throat: Oropharyngeal exudate present.  Enlargement of both tonsils with exudates.  Uvula midline with no peritonsillar fullness.  No trismus  Eyes: Pupils are equal, round, and reactive to light.  Neck: Normal range of  motion. Neck supple.  Cardiovascular: Normal rate, regular rhythm and normal heart sounds.   Pulmonary/Chest: Effort normal and breath sounds normal. No respiratory distress. He has no wheezes. He has no rales. He exhibits no tenderness.  Abdominal: Soft. Bowel sounds are normal. There is no tenderness. There is no rebound and no guarding.  Musculoskeletal: Normal range of motion. He exhibits no edema.  Lymphadenopathy:    He has cervical adenopathy (mild billateral enlargement of cervical lymph nodes).  Neurological: He is alert and oriented to person, place, and time.  Skin: Skin is warm and dry. No rash noted.  Psychiatric: He has a normal mood and affect.    ED Course  Procedures (including critical care time) Labs Review Labs Reviewed  CBC WITH DIFFERENTIAL/PLATELET - Abnormal; Notable for the following:    WBC 17.0 (*)    Neutrophils Relative % 87 (*)    Neutro Abs 14.7 (*)    Lymphocytes Relative 9 (*)    All other components within normal limits  COMPREHENSIVE METABOLIC PANEL - Abnormal; Notable for the following:    Glucose, Bld 106 (*)    Total Protein 8.3 (*)    All other components within normal limits  LIPASE, BLOOD - Abnormal; Notable for the following:    Lipase 16 (*)    All other components within normal limits  RAPID STREP SCREEN (NOT AT [facility])  CULTURE, GROUP A STREP    Imaging Review No results found.   EKG Interpretation None      MDM   Final diagnoses:  Pharyngitis    Patient presents with sore throat myalgias and vomiting/diarrhea. His rapid strep is negative. His lungs are clear without evidence of pneumonia. He does not appear dehydrated. He has no airway compromise or evidence of a peritonsillar abscess. He was offered a steroid shot but refused. He was discharged home in good condition and given strict return precautions to return if he has any worsening throat swelling, vomiting, or other worsening symptoms. He is tolerating oral fluids here  in the emergency room.    Rolan BuccoMelanie Pearl Bents, MD 07/30/14 1539

## 2014-08-01 LAB — CULTURE, GROUP A STREP

## 2014-11-04 ENCOUNTER — Encounter (HOSPITAL_COMMUNITY): Payer: Self-pay | Admitting: Emergency Medicine

## 2014-11-04 DIAGNOSIS — R5383 Other fatigue: Secondary | ICD-10-CM | POA: Insufficient documentation

## 2014-11-04 DIAGNOSIS — Z720 Tobacco use: Secondary | ICD-10-CM | POA: Insufficient documentation

## 2014-11-04 DIAGNOSIS — X30XXXA Exposure to excessive natural heat, initial encounter: Secondary | ICD-10-CM | POA: Insufficient documentation

## 2014-11-04 DIAGNOSIS — R531 Weakness: Secondary | ICD-10-CM | POA: Insufficient documentation

## 2014-11-04 LAB — URINE MICROSCOPIC-ADD ON

## 2014-11-04 LAB — URINALYSIS, ROUTINE W REFLEX MICROSCOPIC
Glucose, UA: NEGATIVE mg/dL
KETONES UR: 15 mg/dL — AB
NITRITE: NEGATIVE
PROTEIN: 100 mg/dL — AB
Specific Gravity, Urine: 1.026 (ref 1.005–1.030)
UROBILINOGEN UA: 0.2 mg/dL (ref 0.0–1.0)
pH: 5 (ref 5.0–8.0)

## 2014-11-04 LAB — CBC
HEMATOCRIT: 47 % (ref 39.0–52.0)
Hemoglobin: 17.2 g/dL — ABNORMAL HIGH (ref 13.0–17.0)
MCH: 32.5 pg (ref 26.0–34.0)
MCHC: 36.6 g/dL — AB (ref 30.0–36.0)
MCV: 88.8 fL (ref 78.0–100.0)
PLATELETS: 290 10*3/uL (ref 150–400)
RBC: 5.29 MIL/uL (ref 4.22–5.81)
RDW: 12.7 % (ref 11.5–15.5)
WBC: 15.5 10*3/uL — AB (ref 4.0–10.5)

## 2014-11-04 LAB — COMPREHENSIVE METABOLIC PANEL
ALBUMIN: 5.7 g/dL — AB (ref 3.5–5.0)
ALK PHOS: 66 U/L (ref 38–126)
ALT: 19 U/L (ref 17–63)
AST: 35 U/L (ref 15–41)
Anion gap: 19 — ABNORMAL HIGH (ref 5–15)
BILIRUBIN TOTAL: 1.4 mg/dL — AB (ref 0.3–1.2)
BUN: 32 mg/dL — AB (ref 6–20)
CALCIUM: 11 mg/dL — AB (ref 8.9–10.3)
CO2: 18 mmol/L — ABNORMAL LOW (ref 22–32)
Chloride: 97 mmol/L — ABNORMAL LOW (ref 101–111)
Creatinine, Ser: 2.21 mg/dL — ABNORMAL HIGH (ref 0.61–1.24)
GFR calc Af Amer: 44 mL/min — ABNORMAL LOW (ref >60)
GFR, EST NON AFRICAN AMERICAN: 38 mL/min — AB (ref >60)
GLUCOSE: 106 mg/dL — AB (ref 65–99)
POTASSIUM: 3.6 mmol/L (ref 3.5–5.1)
Sodium: 134 mmol/L — ABNORMAL LOW (ref 135–145)
TOTAL PROTEIN: 9.6 g/dL — AB (ref 6.5–8.1)

## 2014-11-04 NOTE — ED Notes (Signed)
Patient here with complaint of fatigue and dizziness. States he was climbing ladder today, upon reaching the top he became dizzy and shaky. Patient works in [employer]. Also complaining of diffuse muscle cramps and body pain.

## 2014-11-05 ENCOUNTER — Emergency Department (HOSPITAL_COMMUNITY)
Admission: EM | Admit: 2014-11-05 | Discharge: 2014-11-05 | Discharge status: Against Medical Advice | Payer: Self-pay | Attending: Emergency Medicine | Admitting: Emergency Medicine

## 2014-11-05 LAB — CK: CK TOTAL: 719 U/L — AB (ref 49–397)

## 2014-11-05 MED ORDER — SODIUM CHLORIDE 0.9 % IV BOLUS (SEPSIS): 1000.0000 mL | Freq: Once | INTRAVENOUS | Status: DC

## 2014-11-05 NOTE — ED Notes (Signed)
Called patient back for room assignment and still no answer at 01:06. Called 30 minutes apart and moved off the floor due to no response

## 2014-11-05 NOTE — ED Notes (Signed)
Called patient to go back to room with no answer x 3. Moved  back to waiting room at this time and will call again
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
## 2022-05-09 ENCOUNTER — Other Ambulatory Visit: Payer: Self-pay

## 2022-05-09 ENCOUNTER — Encounter (HOSPITAL_COMMUNITY): Payer: Self-pay

## 2022-05-09 ENCOUNTER — Emergency Department (HOSPITAL_COMMUNITY): Payer: No Typology Code available for payment source

## 2022-05-09 ENCOUNTER — Emergency Department (HOSPITAL_COMMUNITY)
Admission: EM | Admit: 2022-05-09 | Discharge: 2022-05-09 | Disposition: A | Discharge status: Home / Self Care | Payer: No Typology Code available for payment source | Attending: Emergency Medicine | Admitting: Emergency Medicine

## 2022-05-09 DIAGNOSIS — Y92410 Unspecified street and highway as the place of occurrence of the external cause: Secondary | ICD-10-CM | POA: Diagnosis not present

## 2022-05-09 DIAGNOSIS — M546 Pain in thoracic spine: Secondary | ICD-10-CM | POA: Insufficient documentation

## 2022-05-09 DIAGNOSIS — S20212A Contusion of left front wall of thorax, initial encounter: Secondary | ICD-10-CM | POA: Diagnosis not present

## 2022-05-09 DIAGNOSIS — R0781 Pleurodynia: Secondary | ICD-10-CM | POA: Diagnosis present

## 2022-05-09 DIAGNOSIS — R1031 Right lower quadrant pain: Secondary | ICD-10-CM | POA: Diagnosis not present

## 2022-05-09 DIAGNOSIS — M25571 Pain in right ankle and joints of right foot: Secondary | ICD-10-CM | POA: Diagnosis not present

## 2022-05-09 DIAGNOSIS — S8011XA Contusion of right lower leg, initial encounter: Secondary | ICD-10-CM | POA: Insufficient documentation

## 2022-05-09 LAB — COMPREHENSIVE METABOLIC PANEL
ALT: 13 U/L (ref 0–44)
AST: 20 U/L (ref 15–41)
Albumin: 3.7 g/dL (ref 3.5–5.0)
Alkaline Phosphatase: 49 U/L (ref 38–126)
Anion gap: 7 (ref 5–15)
BUN: 9 mg/dL (ref 6–20)
CO2: 22 mmol/L (ref 22–32)
Calcium: 8.7 mg/dL — ABNORMAL LOW (ref 8.9–10.3)
Chloride: 109 mmol/L (ref 98–111)
Creatinine, Ser: 0.82 mg/dL (ref 0.61–1.24)
GFR, Estimated: >60 mL/min (ref >60)
Glucose, Bld: 88 mg/dL (ref 70–99)
Potassium: 3.9 mmol/L (ref 3.5–5.1)
Sodium: 138 mmol/L (ref 135–145)
Total Bilirubin: 0.6 mg/dL (ref 0.3–1.2)
Total Protein: 6.1 g/dL — ABNORMAL LOW (ref 6.5–8.1)

## 2022-05-09 LAB — URINALYSIS, ROUTINE W REFLEX MICROSCOPIC
Bilirubin Urine: NEGATIVE
Glucose, UA: NEGATIVE mg/dL
Hgb urine dipstick: NEGATIVE
Ketones, ur: NEGATIVE mg/dL
Leukocytes,Ua: NEGATIVE
Nitrite: NEGATIVE
Protein, ur: NEGATIVE mg/dL
Specific Gravity, Urine: 1.01 (ref 1.005–1.030)
pH: 8 (ref 5.0–8.0)

## 2022-05-09 LAB — I-STAT CHEM 8, ED
BUN: 11 mg/dL (ref 6–20)
Calcium, Ion: 1.11 mmol/L — ABNORMAL LOW (ref 1.15–1.40)
Chloride: 106 mmol/L (ref 98–111)
Creatinine, Ser: 0.8 mg/dL (ref 0.61–1.24)
Glucose, Bld: 103 mg/dL — ABNORMAL HIGH (ref 70–99)
HCT: 39 % (ref 39.0–52.0)
Hemoglobin: 13.3 g/dL (ref 13.0–17.0)
Potassium: 4.1 mmol/L (ref 3.5–5.1)
Sodium: 140 mmol/L (ref 135–145)
TCO2: 25 mmol/L (ref 22–32)

## 2022-05-09 LAB — CBC
HCT: 36 % — ABNORMAL LOW (ref 39.0–52.0)
Hemoglobin: 12.7 g/dL — ABNORMAL LOW (ref 13.0–17.0)
MCH: 32.4 pg (ref 26.0–34.0)
MCHC: 35.3 g/dL (ref 30.0–36.0)
MCV: 91.8 fL (ref 80.0–100.0)
Platelets: 204 10*3/uL (ref 150–400)
RBC: 3.92 MIL/uL — ABNORMAL LOW (ref 4.22–5.81)
RDW: 13.1 % (ref 11.5–15.5)
WBC: 13 10*3/uL — ABNORMAL HIGH (ref 4.0–10.5)
nRBC: 0 % (ref 0.0–0.2)

## 2022-05-09 LAB — ETHANOL: Alcohol, Ethyl (B): <10 mg/dL (ref <10)

## 2022-05-09 LAB — SAMPLE TO BLOOD BANK

## 2022-05-09 LAB — PROTIME-INR
INR: 1.1 (ref 0.8–1.2)
Prothrombin Time: 13.7 seconds (ref 11.4–15.2)

## 2022-05-09 LAB — LACTIC ACID, PLASMA: Lactic Acid, Venous: 1.5 mmol/L (ref 0.5–1.9)

## 2022-05-09 LAB — LIPASE, BLOOD: Lipase: 29 U/L (ref 11–51)

## 2022-05-09 MED ORDER — CYCLOBENZAPRINE HCL 10 MG PO TABS
10.0000 mg | ORAL_TABLET | Freq: Once | ORAL | Status: AC
  Administered 2022-05-09: 10 mg via ORAL
  Filled 2022-05-09: qty 1

## 2022-05-09 MED ORDER — KETOROLAC TROMETHAMINE 30 MG/ML IJ SOLN
30.0000 mg | Freq: Once | INTRAMUSCULAR | Status: AC
  Administered 2022-05-09: 30 mg via INTRAVENOUS
  Filled 2022-05-09: qty 1

## 2022-05-09 MED ORDER — ONDANSETRON HCL 4 MG/2ML IJ SOLN
4.0000 mg | Freq: Once | INTRAMUSCULAR | Status: AC
  Administered 2022-05-09: 4 mg via INTRAVENOUS
  Filled 2022-05-09: qty 2

## 2022-05-09 MED ORDER — FENTANYL CITRATE PF 50 MCG/ML IJ SOSY
50.0000 ug | PREFILLED_SYRINGE | Freq: Once | INTRAMUSCULAR | Status: AC
  Administered 2022-05-09: 50 ug via INTRAVENOUS
  Filled 2022-05-09: qty 1

## 2022-05-09 MED ORDER — SODIUM CHLORIDE 0.9 % IV BOLUS
500.0000 mL | Freq: Once | INTRAVENOUS | Status: AC
  Administered 2022-05-09: 500 mL via INTRAVENOUS

## 2022-05-09 MED ORDER — CYCLOBENZAPRINE HCL 10 MG PO TABS: 10.0000 mg | ORAL_TABLET | Freq: Two times a day (BID) | ORAL | 0 refills | Status: AC | PRN

## 2022-05-09 MED ORDER — IOHEXOL 350 MG/ML SOLN
75.0000 mL | Freq: Once | INTRAVENOUS | Status: AC | PRN
  Administered 2022-05-09: 75 mL via INTRAVENOUS

## 2022-05-09 MED ORDER — OXYCODONE HCL 5 MG PO TABS
5.0000 mg | ORAL_TABLET | Freq: Once | ORAL | Status: AC
  Administered 2022-05-09: 5 mg via ORAL
  Filled 2022-05-09: qty 1

## 2022-05-09 NOTE — ED Notes (Signed)
RN resent CBC and Blood Sample

## 2022-05-09 NOTE — Progress Notes (Signed)
Orthopedic Tech Progress Note Patient Details:  Bryan Randolph 07/17/1985 [id number]  Ortho Devices Type of Ortho Device: Crutches, ASO Ortho Device/Splint Location: RLE Ortho Device/Splint Interventions: Ordered, Application, Adjustment   Post Interventions Patient Tolerated: Well Instructions Provided: Care of device, Poper ambulation with device, Adjustment of device  Bryan Randolph 05/09/2022, 1:32 PM

## 2022-05-09 NOTE — Discharge Instructions (Addendum)
For your pain, you may take up to '1000mg'$  of acetaminophen (tylenol) 4 times daily for up to a week. This is the maximum dose of acetminophen (tylenol) you can take from all sources. Please check other over-the-counter medications and prescriptions to ensure you are not taking other medications that contain acetaminophen.  You may also take ibuprofen 400 mg 6 times a day OR '600mg'$  4 times a day alternating with or at the same time as tylenol.  You can use these medications in addition to the presribed muscle relaxant (which may make you sleepy) and over the counter lidoderm patch.
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
